# Patient Record
Sex: Male | Born: 1950 | Hispanic: No | Marital: Married | Smoking: Current every day smoker
Health system: Southern US, Community
[De-identification: ages and names within clinical notes are randomized; demographics above are authoritative.]

## PROBLEM LIST (undated history)

## (undated) DIAGNOSIS — I10 Essential (primary) hypertension: Secondary | ICD-10-CM

---

## 2006-08-05 ENCOUNTER — Ambulatory Visit (HOSPITAL_COMMUNITY): Admission: RE | Admit: 2006-08-05 | Discharge: 2006-08-05 | Payer: Self-pay | Admitting: *Deleted

## 2006-08-19 ENCOUNTER — Encounter: Admission: RE | Admit: 2006-08-19 | Discharge: 2006-08-19 | Payer: Self-pay | Admitting: Internal Medicine

## 2010-10-26 ENCOUNTER — Encounter: Payer: Self-pay | Admitting: Internal Medicine

## 2017-11-26 ENCOUNTER — Other Ambulatory Visit: Payer: Self-pay

## 2017-11-26 ENCOUNTER — Emergency Department (HOSPITAL_BASED_OUTPATIENT_CLINIC_OR_DEPARTMENT_OTHER): Payer: No Typology Code available for payment source

## 2017-11-26 ENCOUNTER — Encounter (HOSPITAL_BASED_OUTPATIENT_CLINIC_OR_DEPARTMENT_OTHER): Payer: Self-pay | Admitting: *Deleted

## 2017-11-26 ENCOUNTER — Emergency Department (HOSPITAL_BASED_OUTPATIENT_CLINIC_OR_DEPARTMENT_OTHER)
Admission: EM | Admit: 2017-11-26 | Discharge: 2017-11-26 | Disposition: A | Discharge status: Home / Self Care | Payer: No Typology Code available for payment source | Attending: Emergency Medicine | Admitting: Emergency Medicine

## 2017-11-26 DIAGNOSIS — S199XXA Unspecified injury of neck, initial encounter: Secondary | ICD-10-CM | POA: Diagnosis present

## 2017-11-26 DIAGNOSIS — Y999 Unspecified external cause status: Secondary | ICD-10-CM | POA: Diagnosis not present

## 2017-11-26 DIAGNOSIS — Z79899 Other long term (current) drug therapy: Secondary | ICD-10-CM | POA: Diagnosis not present

## 2017-11-26 DIAGNOSIS — Y929 Unspecified place or not applicable: Secondary | ICD-10-CM | POA: Diagnosis not present

## 2017-11-26 DIAGNOSIS — Y939 Activity, unspecified: Secondary | ICD-10-CM | POA: Insufficient documentation

## 2017-11-26 DIAGNOSIS — S161XXA Strain of muscle, fascia and tendon at neck level, initial encounter: Secondary | ICD-10-CM | POA: Diagnosis not present

## 2017-11-26 DIAGNOSIS — I10 Essential (primary) hypertension: Secondary | ICD-10-CM | POA: Diagnosis not present

## 2017-11-26 DIAGNOSIS — F17210 Nicotine dependence, cigarettes, uncomplicated: Secondary | ICD-10-CM | POA: Diagnosis not present

## 2017-11-26 HISTORY — DX: Essential (primary) hypertension: I10

## 2017-11-26 MED ORDER — METHOCARBAMOL 500 MG PO TABS: 500.0000 mg | ORAL_TABLET | Freq: Every evening | ORAL | 0 refills | Status: AC | PRN

## 2017-11-26 MED ORDER — ACETAMINOPHEN 325 MG PO TABS
650.0000 mg | ORAL_TABLET | Freq: Once | ORAL | Status: AC
Start: 2017-11-26 — End: 2017-11-26
  Administered 2017-11-26: 650 mg via ORAL
  Filled 2017-11-26: qty 2

## 2017-11-26 MED ORDER — IBUPROFEN 400 MG PO TABS: 600.0000 mg | ORAL_TABLET | Freq: Once | ORAL | Status: DC

## 2017-11-26 MED FILL — METHOCARBAMOL 500 MG TABLET: 500 | 12 days supply | Qty: 12 | Fill #0
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
## 2017-11-26 NOTE — ED Notes (Signed)
NAD at this time. Pt is stable and going home.  

## 2017-11-26 NOTE — ED Notes (Signed)
Patient c/o pain from his posterior neck, right upper shoulder and all the way to his right arm. No bruise or open skin area.
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
